# Patient Record
Sex: Male | Born: 1985 | Hispanic: Yes | Marital: Married | Smoking: Never smoker
Health system: Southern US, Community
[De-identification: ages and names within clinical notes are randomized; demographics above are authoritative.]

## PROBLEM LIST (undated history)

## (undated) DIAGNOSIS — I10 Essential (primary) hypertension: Secondary | ICD-10-CM

---

## 2018-04-07 ENCOUNTER — Encounter (HOSPITAL_COMMUNITY): Payer: Self-pay | Admitting: Emergency Medicine

## 2018-04-07 ENCOUNTER — Emergency Department (HOSPITAL_COMMUNITY)
Admission: EM | Admit: 2018-04-07 | Discharge: 2018-04-07 | Disposition: A | Discharge status: Home / Self Care | Payer: Self-pay | Attending: Emergency Medicine | Admitting: Emergency Medicine

## 2018-04-07 ENCOUNTER — Other Ambulatory Visit: Payer: Self-pay

## 2018-04-07 ENCOUNTER — Emergency Department (HOSPITAL_COMMUNITY): Payer: Self-pay

## 2018-04-07 DIAGNOSIS — M436 Torticollis: Secondary | ICD-10-CM | POA: Insufficient documentation

## 2018-04-07 MED ORDER — METHOCARBAMOL 500 MG PO TABS
1000.0000 mg | ORAL_TABLET | Freq: Once | ORAL | Status: AC
  Administered 2018-04-07: 1000 mg via ORAL
  Filled 2018-04-07: qty 2

## 2018-04-07 MED ORDER — CYCLOBENZAPRINE HCL 10 MG PO TABS: 10.0000 mg | ORAL_TABLET | Freq: Two times a day (BID) | ORAL | 0 refills | Status: DC | PRN

## 2018-04-07 MED ORDER — ACETAMINOPHEN 325 MG PO TABS
650.0000 mg | ORAL_TABLET | Freq: Once | ORAL | Status: AC
  Administered 2018-04-07: 650 mg via ORAL
  Filled 2018-04-07: qty 2

## 2018-04-07 MED ORDER — IBUPROFEN 400 MG PO TABS
600.0000 mg | ORAL_TABLET | Freq: Once | ORAL | Status: AC
  Administered 2018-04-07: 600 mg via ORAL
  Filled 2018-04-07: qty 1

## 2018-04-07 NOTE — ED Provider Notes (Signed)
[facility] EMERGENCY DEPARTMENT Provider Note   CSN: [id number] Arrival date & time: 04/07/18  0944     History   Chief Complaint Chief Complaint  Patient presents with  . Neck Pain    HPI Michael Brown is a 32 y.o. male with no significant past medical history presents emergency department today for right-sided neck pain.  Patient reports that he awoke this morning with a "crick" in his neck.  He feels like he has a cramp on the right side of his neck.  He reports he does not take anything for his symptoms.  He reports palpation and range of motion make his symptoms worse.  The pain is constant.  He denies any pain with neck flexion or extension.  No fever, visual changes, vertigo, numbness/tingling/weakness of the extremity, bowel/bladder incontinence, chest pain, shortness of breath, neck trauma, difficulty swallowing reported.  No previous neck surgeries.  HPI  History reviewed. No pertinent past medical history.  There are no active problems to display for this patient.   No significant past medical history or surgical history reported      Home Medications    Prior to Admission medications   Not on File    Family History No family history on file.  Social History Social History   Tobacco Use  . Smoking status: Never Smoker  . Smokeless tobacco: Never Used  Substance Use Topics  . Alcohol use: Yes  . Drug use: Not Currently     Allergies   Patient has no allergy information on record.   Review of Systems Review of Systems  Constitutional: Negative for chills and fever.  HENT: Negative for sore throat, trouble swallowing and voice change.   Eyes: Negative for visual disturbance.  Respiratory: Negative for cough and shortness of breath.   Cardiovascular: Negative for chest pain.  Gastrointestinal: Negative for nausea.       No bowel incontinence  Genitourinary: Negative for difficulty urinating.       No urinary  incontinence  Musculoskeletal: Positive for neck pain. Negative for arthralgias and neck stiffness.  Skin: Negative for color change and wound.  Neurological: Negative for dizziness, weakness, numbness and headaches.  All other systems reviewed and are negative.    Physical Exam Updated Vital Signs BP (!) 146/99 (BP Location: Left Arm)   Pulse 84   Temp 98.1 F (36.7 C) (Oral)   Resp 16   SpO2 100%   Physical Exam  Constitutional: He appears well-developed and well-nourished.  HENT:  Head: Normocephalic and atraumatic.  Right Ear: External ear normal.  Left Ear: External ear normal.  Nose: Nose normal.  Mouth/Throat: Uvula is midline, oropharynx is clear and moist and mucous membranes are normal. No tonsillar exudate.  Eyes: Pupils are equal, round, and reactive to light. Right eye exhibits no discharge. Left eye exhibits no discharge. No scleral icterus.  Neck: Trachea normal. Neck supple. No JVD present. Muscular tenderness present. No spinous process tenderness present. Carotid bruit is not present. No neck rigidity. Normal range of motion present.  Neck in a flexed and turned to the left position at 45 degrees.  There is tenderness palpation of the right trapezius and sternocleidomastoid.  No bony tenderness palpation or step-offs.  No meningismus or meningeal signs.  Cardiovascular: Normal rate, regular rhythm and intact distal pulses.  No murmur heard. Pulses:      Radial pulses are 2+ on the right side, and 2+ on the left side.  Dorsalis pedis pulses are 2+ on the right side, and 2+ on the left side.       Posterior tibial pulses are 2+ on the right side, and 2+ on the left side.  No lower extremity swelling or edema. Calves symmetric in size bilaterally.  Pulmonary/Chest: Effort normal and breath sounds normal. He exhibits no tenderness.  Abdominal: Soft. Bowel sounds are normal. There is no tenderness. There is no rebound and no guarding.  Musculoskeletal: He  exhibits no edema.  Lymphadenopathy:    He has no cervical adenopathy.  Neurological: He is alert.  Speech clear. Follows commands. No facial droop. PERRLA. EOMI. Normal peripheral fields. CN III-XII intact.  Grossly moves all extremities 4 without ataxia. Coordination intact. Able and appropriate strength for age to upper and lower extremities bilaterally including grip strength & plantar flexion/dorsiflexion. Sensation to light touch intact bilaterally for upper and lower. Patellar deep tendon reflex 2+ and equal bilaterally. Normal finger to nose. No pronator drift. Normal gait.   Skin: Skin is warm and dry. No rash noted. He is not diaphoretic.  Psychiatric: He has a normal mood and affect.  Nursing note and vitals reviewed.    ED Treatments / Results  Labs (all labs ordered are listed, but only abnormal results are displayed) Labs Reviewed - No data to display  EKG None  Radiology No results found.  Procedures Procedures (including critical care time)  Medications Ordered in ED Medications  methocarbamol (ROBAXIN) tablet 1,000 mg (has no administration in time range)  acetaminophen (TYLENOL) tablet 650 mg (has no administration in time range)  ibuprofen (ADVIL,MOTRIN) tablet 600 mg (has no administration in time range)     Initial Impression / Assessment and Plan / ED Course  I have reviewed the triage vital signs and the nursing notes.  Pertinent labs & imaging results that were available during my care of the patient were reviewed by me and considered in my medical decision making (see chart for details).     32 year old male with atraumatic neck pain.  Patient is afebrile, without meningeal signs.  Do not suspect meningitis.  Patient with normal neurologic exam.  No bowel/bladder incontinence.  Do not feel needs work-up for dissection.  No concern for cauda equina.  X-rays with without fracture dislocation and consistent with torticollis.  These were reviewed by  myself.  Patient's symptoms improved in the department.  Will discharge home on muscle relaxers and follow with PCP.  Return precautions discussed.  Patient appears safe for discharge.  Final Clinical Impressions(s) / ED Diagnoses   Final diagnoses:  Torticollis    ED Discharge Orders         Ordered    cyclobenzaprine (FLEXERIL) 10 MG tablet  2 times daily PRN     04/07/18 1156           Princella PellegriniMaczis, Charlena Haub M, PA-C 04/07/18 1159    Gerhard MunchLockwood, Robert, MD 04/07/18 1626

## 2018-04-07 NOTE — Discharge Instructions (Signed)
Your xray and exam is consistent with torticollis as we discussed.  Please see attached handout on this.  Please follow-up with her primary care provider.  Take medications as prescribed.  Please not drive or operate heavy machinery while taking medications as they may make you sleepy. If you develop worsening or new concerning symptoms you can return to the emergency department for re-evaluation.

## 2018-04-07 NOTE — ED Notes (Signed)
Patient verbalizes understanding of discharge instructions. Opportunity for questioning and answers were provided. Armband removed by staff, pt discharged from ED ambulatory.   

## 2018-04-07 NOTE — ED Notes (Signed)
Please see Emergency Provider's history and physical for full assessment. 

## 2018-04-07 NOTE — ED Triage Notes (Signed)
Pt. Stated, I woke up this morning with neck pain like a cramp.

## 2018-04-07 NOTE — ED Notes (Signed)
Patient transported to x-ray. ?

## 2019-06-02 ENCOUNTER — Other Ambulatory Visit: Payer: Self-pay

## 2019-06-02 DIAGNOSIS — Z20822 Contact with and (suspected) exposure to covid-19: Secondary | ICD-10-CM

## 2019-06-04 LAB — NOVEL CORONAVIRUS, NAA: SARS-CoV-2, NAA: DETECTED — AB

## 2019-12-19 DIAGNOSIS — I639 Cerebral infarction, unspecified: Secondary | ICD-10-CM

## 2019-12-19 HISTORY — DX: Cerebral infarction, unspecified: I63.9

## 2020-05-25 ENCOUNTER — Encounter (HOSPITAL_COMMUNITY): Payer: Self-pay

## 2020-05-25 ENCOUNTER — Emergency Department (HOSPITAL_COMMUNITY): Payer: Self-pay

## 2020-05-25 ENCOUNTER — Emergency Department (HOSPITAL_COMMUNITY)
Admission: EM | Admit: 2020-05-25 | Discharge: 2020-05-26 | Disposition: A | Discharge status: Home / Self Care | Payer: Self-pay | Attending: Emergency Medicine | Admitting: Emergency Medicine

## 2020-05-25 ENCOUNTER — Other Ambulatory Visit: Payer: Self-pay

## 2020-05-25 DIAGNOSIS — R202 Paresthesia of skin: Secondary | ICD-10-CM | POA: Insufficient documentation

## 2020-05-25 LAB — CBC
HCT: 44.7 % (ref 39.0–52.0)
Hemoglobin: 15.8 g/dL (ref 13.0–17.0)
MCH: 31.3 pg (ref 26.0–34.0)
MCHC: 35.3 g/dL (ref 30.0–36.0)
MCV: 88.7 fL (ref 80.0–100.0)
Platelets: 256 10*3/uL (ref 150–400)
RBC: 5.04 MIL/uL (ref 4.22–5.81)
RDW: 12 % (ref 11.5–15.5)
WBC: 7.7 10*3/uL (ref 4.0–10.5)
nRBC: 0 % (ref 0.0–0.2)

## 2020-05-25 LAB — DIFFERENTIAL
Abs Immature Granulocytes: 0.02 10*3/uL (ref 0.00–0.07)
Basophils Absolute: 0.1 10*3/uL (ref 0.0–0.1)
Basophils Relative: 1 %
Eosinophils Absolute: 0.3 10*3/uL (ref 0.0–0.5)
Eosinophils Relative: 3 %
Immature Granulocytes: 0 %
Lymphocytes Relative: 28 %
Lymphs Abs: 2.2 10*3/uL (ref 0.7–4.0)
Monocytes Absolute: 0.6 10*3/uL (ref 0.1–1.0)
Monocytes Relative: 8 %
Neutro Abs: 4.6 10*3/uL (ref 1.7–7.7)
Neutrophils Relative %: 60 %

## 2020-05-25 LAB — APTT: aPTT: 26 seconds (ref 24–36)

## 2020-05-25 LAB — COMPREHENSIVE METABOLIC PANEL
ALT: 23 U/L (ref 0–44)
AST: 26 U/L (ref 15–41)
Albumin: 4.5 g/dL (ref 3.5–5.0)
Alkaline Phosphatase: 55 U/L (ref 38–126)
Anion gap: 12 (ref 5–15)
BUN: 14 mg/dL (ref 6–20)
CO2: 20 mmol/L — ABNORMAL LOW (ref 22–32)
Calcium: 9.6 mg/dL (ref 8.9–10.3)
Chloride: 107 mmol/L (ref 98–111)
Creatinine, Ser: 0.96 mg/dL (ref 0.61–1.24)
GFR, Estimated: >60 mL/min (ref >60)
Glucose, Bld: 100 mg/dL — ABNORMAL HIGH (ref 70–99)
Potassium: 3.5 mmol/L (ref 3.5–5.1)
Sodium: 139 mmol/L (ref 135–145)
Total Bilirubin: 1.2 mg/dL (ref 0.3–1.2)
Total Protein: 7.6 g/dL (ref 6.5–8.1)

## 2020-05-25 LAB — I-STAT CHEM 8, ED
BUN: 15 mg/dL (ref 6–20)
Calcium, Ion: 1.23 mmol/L (ref 1.15–1.40)
Chloride: 107 mmol/L (ref 98–111)
Creatinine, Ser: 0.8 mg/dL (ref 0.61–1.24)
Glucose, Bld: 89 mg/dL (ref 70–99)
HCT: 45 % (ref 39.0–52.0)
Hemoglobin: 15.3 g/dL (ref 13.0–17.0)
Potassium: 3.4 mmol/L — ABNORMAL LOW (ref 3.5–5.1)
Sodium: 140 mmol/L (ref 135–145)
TCO2: 21 mmol/L — ABNORMAL LOW (ref 22–32)

## 2020-05-25 LAB — PROTIME-INR
INR: 1.1 (ref 0.8–1.2)
Prothrombin Time: 13.5 seconds (ref 11.4–15.2)

## 2020-05-25 MED ORDER — DIPHENHYDRAMINE HCL 50 MG/ML IJ SOLN: 12.5000 mg | Freq: Once | INTRAMUSCULAR | Status: DC

## 2020-05-25 MED ORDER — SODIUM CHLORIDE 0.9% FLUSH
3.0000 mL | Freq: Once | INTRAVENOUS | Status: DC
Start: 2020-05-25 — End: 2020-05-26

## 2020-05-25 MED ORDER — METOCLOPRAMIDE HCL 5 MG/ML IJ SOLN: 10.0000 mg | Freq: Once | INTRAMUSCULAR | Status: DC

## 2020-05-25 NOTE — ED Provider Notes (Signed)
[facility] EMERGENCY DEPARTMENT Provider Note   CSN: [id number] Arrival date & time: 05/25/20  1907     History Chief Complaint  Patient presents with   Numbness    Michael Brown is a 34 y.o. male with a history of prior CVA w/ left vertebral artery dissection who presents to the ED with complaints of intermittent paresthesias since this afternoon around 1PM.  Patient states that his symptoms began as left upper and lower extremity numbness/tingling, subsequently developed some right upper and lower extremity tingling as well.  His paresthesias have been intermittent lasting a few minutes at a time in variable locations to the extremities.  Sometime in the early evening he got a mild discomfort to the back of his head.  His combination of intermittent paresthesias and discomfort prompted him to present to the emergency department.  He states while he was in the lobby he did have an episode of right-sided facial paresthesias.  He mentions that he had some left upper extremity weakness intermittently over the past 2 days.  He currently is asymptomatic with the exception that his bilateral lower extremities feel "a bit shaky".  He has no current pain, numbness, tingling, or weakness.  He denies visual disturbance, speech change, facial droop, fever, chills, chest pain, dyspnea, abdominal pain, nausea, vomiting, or diarrhea.  He denies any recent strenuous activity or traumatic injuries.  Patient's preferred language is documented as [place], he states he speaks both [place] and [place], I discussed whether not he would prefer to utilize a Nurse, learning disability for communication in [place], he would prefer to speak [place].  He was able to provide appropriate history.  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     There are no problems to display for this patient.   History  reviewed. No pertinent surgical history.     No family history on file.  Social History   Tobacco Use   Smoking status: Never Smoker   Smokeless tobacco: Never Used  Substance Use Topics   Alcohol use: Yes   Drug use: Not Currently    Home Medications Prior to Admission medications   Medication Sig Start Date End Date Taking? Authorizing Provider  cyclobenzaprine (FLEXERIL) 10 MG tablet Take 1 tablet (10 mg total) by mouth 2 (two) times daily as needed for muscle spasms. 04/07/18   Maczis, Elmer Sow, PA-C    Allergies    Patient has no known allergies.  Review of Systems   Review of Systems  Constitutional: Negative for chills and fever.  Respiratory: Negative for cough and shortness of breath.   Cardiovascular: Negative for chest pain.  Gastrointestinal: Negative for abdominal pain, blood in stool, constipation, diarrhea, nausea and vomiting.  Genitourinary: Negative for dysuria.  Musculoskeletal: Negative for myalgias.  Neurological: Positive for weakness, numbness and headaches. Negative for dizziness, seizures, syncope and speech difficulty.  All other systems reviewed and are negative.   Physical Exam Updated Vital Signs BP (!) 139/93    Pulse 65    Temp 98.6 F (37 C) (Oral)    Resp 17    SpO2 100%   Physical Exam Vitals and nursing note reviewed.  Constitutional:      General: He is not in acute distress.    Appearance: Normal appearance. He is not toxic-appearing.  HENT:     Head: Normocephalic and atraumatic.     Mouth/Throat:  Pharynx: Oropharynx is clear. Uvula midline.  Eyes:     General: Vision grossly intact. Gaze aligned appropriately.     Extraocular Movements: Extraocular movements intact.     Conjunctiva/sclera: Conjunctivae normal.     Pupils: Pupils are equal, round, and reactive to light.     Comments: No proptosis.   Cardiovascular:     Rate and Rhythm: Normal rate and regular rhythm.  Pulmonary:     Effort: Pulmonary effort is  normal.     Breath sounds: Normal breath sounds.  Abdominal:     General: There is no distension.     Palpations: Abdomen is soft.     Tenderness: There is no abdominal tenderness. There is no guarding or rebound.  Musculoskeletal:     Cervical back: Normal range of motion and neck supple. No rigidity.  Skin:    General: Skin is warm and dry.  Neurological:     Mental Status: He is alert.     Comments: Alert. Clear speech. No facial droop. CNIII-XII grossly intact. Bilateral upper and lower extremities' sensation grossly intact. 5/5 symmetric strength with grip strength and with plantar and dorsi flexion bilaterally . Normal finger to nose bilaterally. Negative pronator drift. Gait intact.    Psychiatric:        Mood and Affect: Mood normal.        Behavior: Behavior normal.    ED Results / Procedures / Treatments   Labs (all labs ordered are listed, but only abnormal results are displayed) Labs Reviewed  COMPREHENSIVE METABOLIC PANEL - Abnormal; Notable for the following components:      Result Value   CO2 20 (*)    Glucose, Bld 100 (*)    All other components within normal limits  I-STAT CHEM 8, ED - Abnormal; Notable for the following components:   Potassium 3.4 (*)    TCO2 21 (*)    All other components within normal limits  PROTIME-INR  APTT  CBC  DIFFERENTIAL  CBG MONITORING, ED    EKG EKG Interpretation  Date/Time:  Tuesday May 25 2020 22:21:17 EDT Ventricular Rate:  79 PR Interval:    QRS Duration: 99 QT Interval:  396 QTC Calculation: 454 R Axis:   74 Text Interpretation: Sinus rhythm Multiple ventricular premature complexes np previous Confirmed by Vanetta Mulders [id number]) on 05/25/2020 11:45:08 PM   Radiology CT HEAD WO CONTRAST  Addendum Date: 05/25/2020   ADDENDUM REPORT: 05/25/2020 21:47 ADDENDUM: These results were called by telephone at the time of interpretation on 05/25/2020 at 9:46 pm to provider Vanetta Mulders , who verbally  acknowledged these results. Electronically Signed   By: Tish Frederickson M.D.   On: 05/25/2020 21:47   Result Date: 05/25/2020 CLINICAL DATA:  numbness/tingling left arm and left leg, headache. Onset 1 pm left arm and left leg numbness and tingling. Onset 6pm posterior headache EXAM: CT HEAD WITHOUT CONTRAST TECHNIQUE: Contiguous axial images were obtained from the base of the skull through the vertex without intravenous contrast. COMPARISON:  None. FINDINGS: Brain: Left cerebellar hypodensity with otherwise no evidence of large-territorial acute infarction. No parenchymal hemorrhage. No mass lesion. No extra-axial collection. No mass effect or midline shift. No hydrocephalus. Basilar cisterns are patent. Vascular: No hyperdense vessel. Skull: No acute fracture or focal lesion. Sinuses/Orbits: Paranasal sinuses and mastoid air cells are clear. The orbits are unremarkable. Other: None. IMPRESSION: 1. Age-indeterminate left cerebellar infarction. Recommend MRI noncontrast for further evaluation. 2. No intracranial hemorrhage. Electronically Signed: By: Tish Frederickson  M.D. On: 05/25/2020 21:42   MR BRAIN WO CONTRAST  Result Date: 05/26/2020 CLINICAL DATA:  Left sided numbness and tingling EXAM: MRI HEAD WITHOUT CONTRAST TECHNIQUE: Multiplanar, multiecho pulse sequences of the brain and surrounding structures were obtained without intravenous contrast. COMPARISON:  None. FINDINGS: Brain: No acute infarct, acute hemorrhage or extra-axial collection. Normal white matter signal. Old left cerebellar infarct. Normal volume of CSF spaces. No chronic microhemorrhage. Normal midline structures. Vascular: Normal flow voids. Skull and upper cervical spine: Normal marrow signal. Sinuses/Orbits: Negative. Other: None. IMPRESSION: Old left cerebellar infarct. Otherwise normal brain MRI. Electronically Signed   By: Deatra Robinson M.D.   On: 05/26/2020 03:04    Procedures Procedures (including critical care  time)  Medications Ordered in ED Medications  sodium chloride flush (NS) 0.9 % injection 3 mL (has no administration in time range)    ED Course  I have reviewed the triage vital signs and the nursing notes.  Pertinent labs & imaging results that were available during my care of the patient were reviewed by me and considered in my medical decision making (see chart for details).    MDM Rules/Calculators/A&P                          Patient presents to the ED with complaints of paresthesias, weakness, & headache- relatively asymptomatic currently other than "shakiness" to his legs. He is nontoxic, resting comfortably, vitals WNL with the exception of mildly elevated BP. Physical exam is benign. The patient has no focal neurologic deficits.   Additional history obtained:  Additional history obtained from chart review & nursing note review. Previous records obtained and reviewed: Hospital admission 12/19/19 after presenting with complaints of visual disturbance, headache, dizziness, & difficulty walking found to have acute ischemic infarcts in the left cerebellum as well as small punctate in the right cerebellum that were felt to be due to spontaneous left vertebral artery dissection. No TPA administration, no surgical intervention. Was started on coumadin & discharged. Ultimately coumadin was discontinued - he is currently on aspirin.   Lab Tests:  I reviewed and interpreted labs, which included:  CBC, differential, CMP, APTT, PT/INR- fairly unremarkable.   Imaging Studies ordered:  CT head wo contrast ordered per triage, I independently visualized and interpreted imaging which showed Age-indeterminate left cerebellar infarction. Recommend MRI noncontrast for further evaluation. No intracranial hemorrhage.  Benign physical exam, no focal neuro deficits, outside of TPA window & is VAN negative- no code stroke.   22:50: CONSULT: Discussed case with neurologist Dr. Amada Jupiter, recommends  MRI brain without contrast, if no acute process can likely be discharged home, in agreement with migraine cocktail trial.  Appreciate consultation.  MRI:  Old left cerebellar infarct. Otherwise normal brain MRI Reassuring ED work up in regards to labs & imaging.  No acute process on MRI, patient remains asymptomatic on reevaluation.  Overall reassuring work-up.  Will discharge home at this time with follow-up with his outpatient neurologist. I discussed results, treatment plan, need for follow-up, and return precautions with the patient. Provided opportunity for questions, patient confirmed understanding and is in agreement with plan.   Findings and plan of care discussed with supervising physician Dr. Deretha Emory who is in agreement.   Portions of this note were generated with Scientist, clinical (histocompatibility and immunogenetics). Dictation errors may occur despite best attempts at proofreading.  Final Clinical Impression(s) / ED Diagnoses Final diagnoses:  Paresthesia    Rx / DC Orders ED Discharge Orders  None       Cherly Andersonetrucelli, Carlyn Mullenbach R, PA-C 05/26/20 [id number]    Vanetta MuldersZackowski, Scott, MD 05/27/20 [phone]

## 2020-05-25 NOTE — ED Triage Notes (Signed)
Onset 1 pm left arm and left leg numbness and tingling.  Onset 6pm posterior headache.   No facial droop.  Hand grips equal Negative VAN.

## 2020-05-26 ENCOUNTER — Emergency Department (HOSPITAL_COMMUNITY): Payer: Self-pay

## 2020-05-26 NOTE — ED Notes (Signed)
Pt would prefer to not have IV access at this time. RN will continue to monitor for need to have IV access.

## 2020-05-26 NOTE — ED Notes (Signed)
This RN called MRI to confirm scan. Pt will be taken to MRI shortly. RN will continue to monitor.

## 2020-05-26 NOTE — ED Notes (Signed)
Pt transported to MRI at this time 

## 2020-05-26 NOTE — Discharge Instructions (Addendum)
You were seen in the emergency department today for tingling and pain in the back of your head.  Your work-up was overall reassuring.  Your blood work did not show significant abnormalities.  We performed an MRI which showed findings of your prior stroke, however there were no new abnormalities.  Please take Tylenol per the counter dosing to help with any return of pain.  Please follow-up with your neurologist within the next 1 to 3 days for reevaluation.  Return to the ER for new or worsening symptoms including but not limited to return of tingling/numbness, weakness, worsening pain, sudden onset of pain, fever, trouble with your speech, change in your vision, trouble walking, dizziness like the room spinning, persistent numbness/weakness on one side of your body, or any other concerns.

## 2021-08-01 IMAGING — MR MR HEAD W/O CM
10 of 11 series · 42 of 48 positions shown · non-contrast
Comparison: None.

CLINICAL DATA: Left sided numbness and tingling

EXAM:
MRI HEAD WITHOUT CONTRAST
TECHNIQUE: Multiplanar, multiecho pulse sequences of the brain and surrounding
structures were obtained without intravenous contrast.

[Series 5: DWI · axial · 3.0mm · 0.88mm/px · z∈[-65,+74]mm · 9 of 96 slices shown (1 of 4)]
[im 1/96]
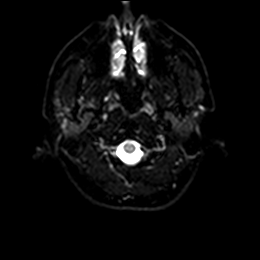
[im 12/96]
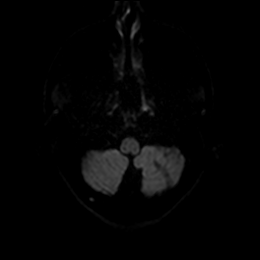
[im 24/96]
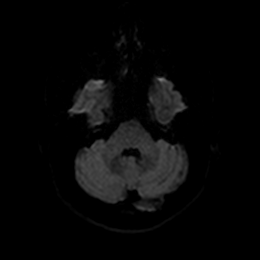
[im 36/96]
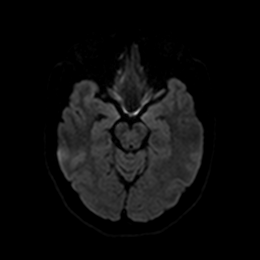
[im 48/96]
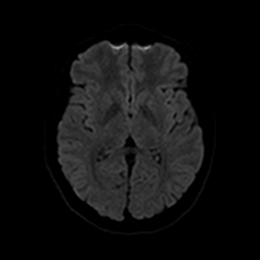
[im 60/96]
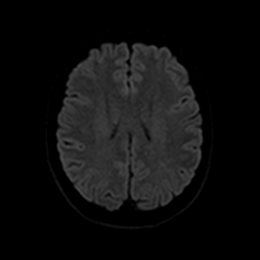
[im 72/96]
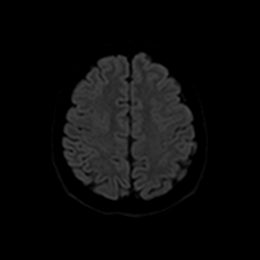
[im 84/96]
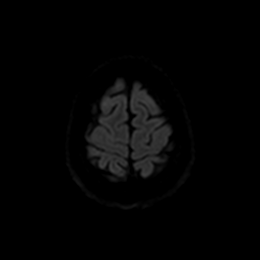
[im 96/96]
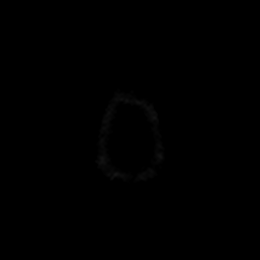

[Series 6: DWI · axial · 3.0mm · 0.88mm/px · z∈[-65,+74]mm · 4 of 48 slices shown (2 of 4)]
[im 1/48]
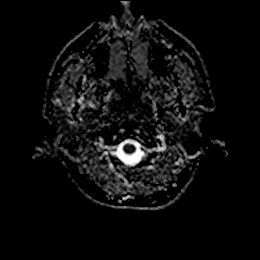
[im 16/48]
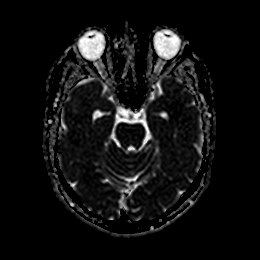
[im 32/48]
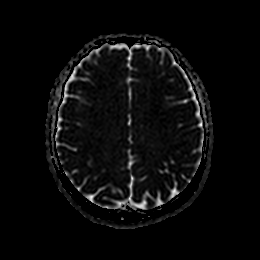
[im 48/48]
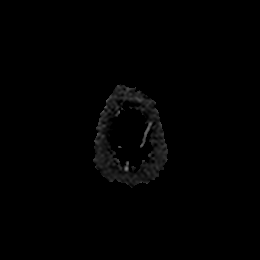

[Series 7: DWI · coronal · 4.0mm · 0.88mm/px · 6 of 64 slices shown (3 of 4)]
[im 1/64]
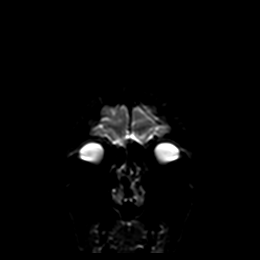
[im 13/64]
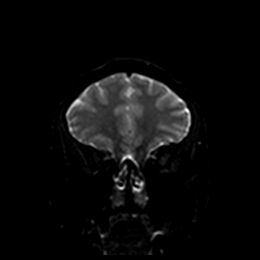
[im 26/64]
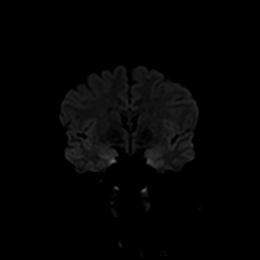
[im 38/64]
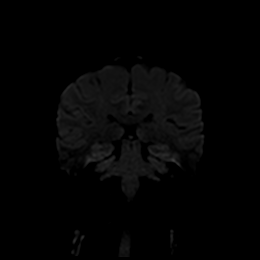
[im 51/64]
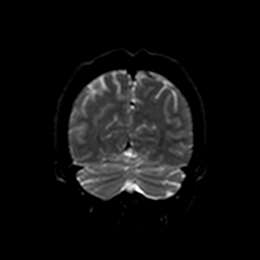
[im 64/64]
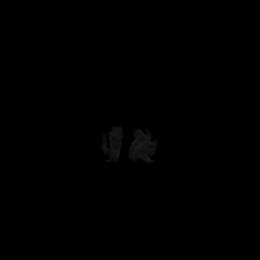

[Series 8: DWI · coronal · 4.0mm · 0.88mm/px · 3 of 32 slices shown (4 of 4)]
[im 1/32]
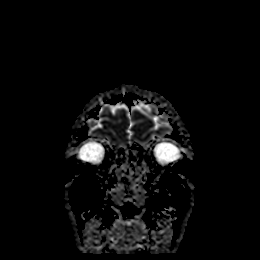
[im 16/32]
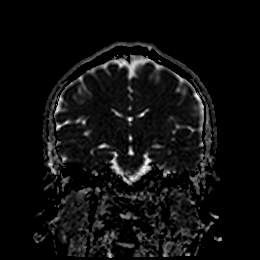
[im 32/32]
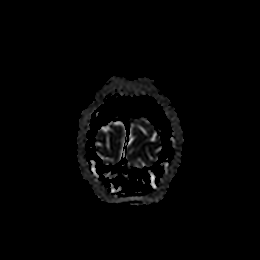

[Series 9: T1 · sagittal · 5.0mm · 0.75mm/px · 2 of 23 slices shown]
[im 1/23]
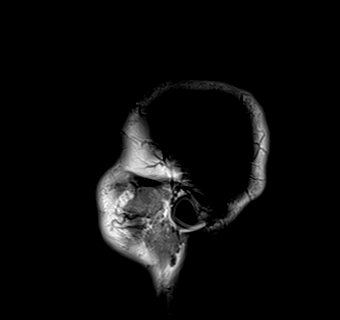
[im 23/23]
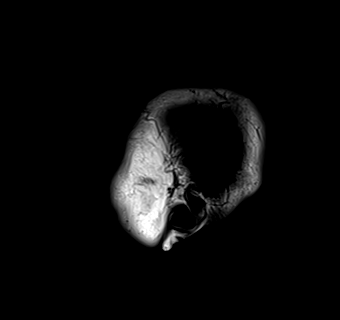

[Series 10: T2 · axial · 5.0mm · 0.72mm/px · z∈[-66,+76]mm · 2 of 25 slices shown (1 of 2)]
[im 1/25]
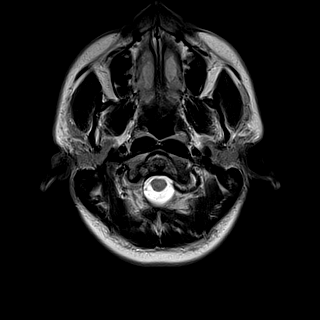
[im 25/25]
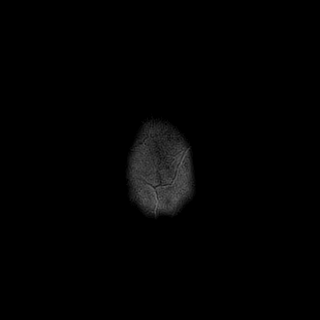

[Series 11: FLAIR · axial · 5.0mm · 0.45mm/px · z∈[-66,+76]mm · 2 of 25 slices shown]
[im 1/25]
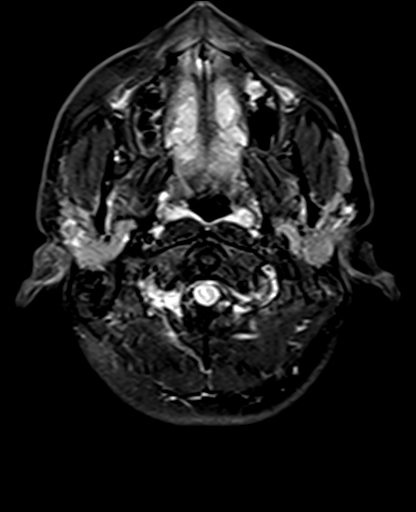
[im 25/25]
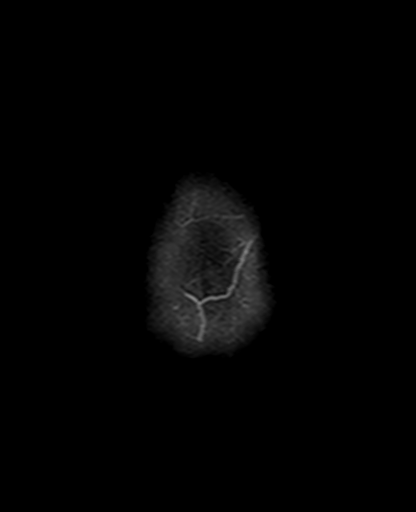

[Series 13: pha_images · axial · 3.0mm · 0.90mm/px · z∈[-83,+89]mm · 5 of 58 slices shown]
[im 1/58]
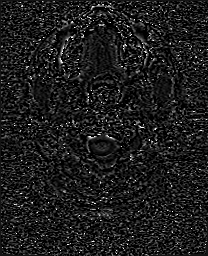
[im 15/58]
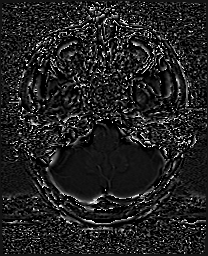
[im 29/58]
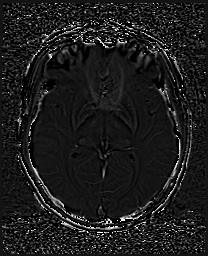
[im 43/58]
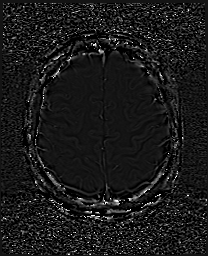
[im 58/58]
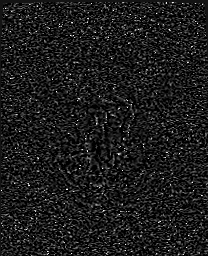

[Series 14: swi_images · axial · 3.0mm · 0.90mm/px · z∈[-83,+92]mm · 6 of 60 slices shown]
[im 1/60]
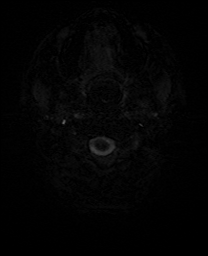
[im 12/60]
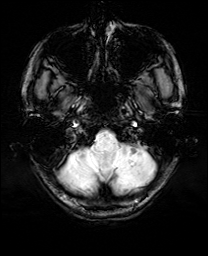
[im 24/60]
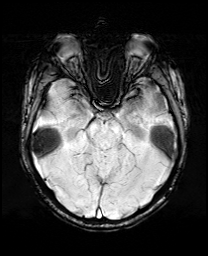
[im 36/60]
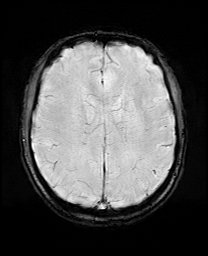
[im 48/60]
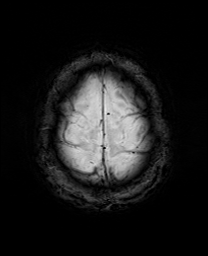
[im 60/60]
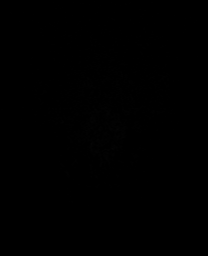

[Series 17: T2 · coronal · 5.0mm · 0.34mm/px · 3 of 29 slices shown (2 of 2)]
[im 1/29]
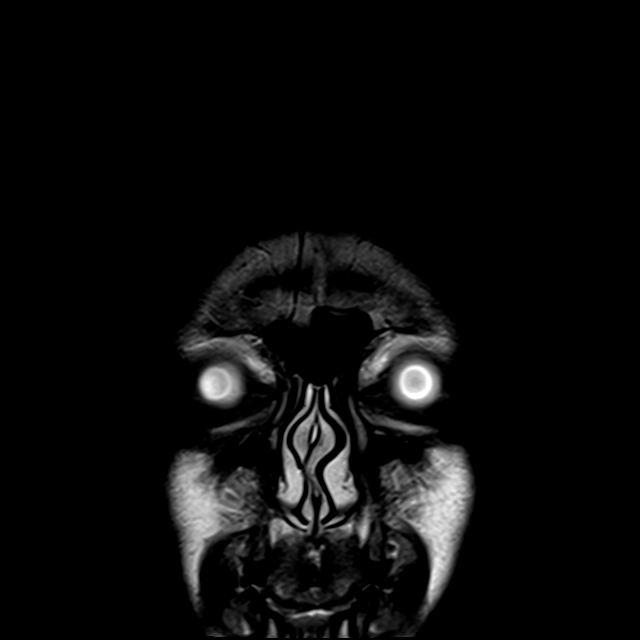
[im 15/29]
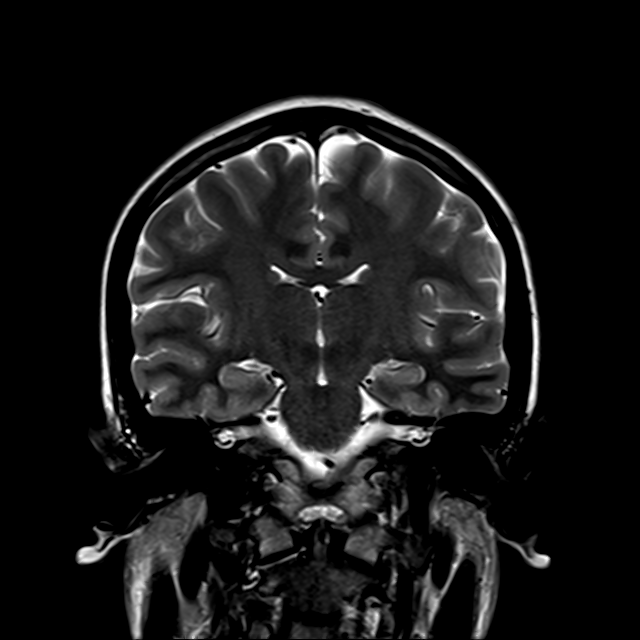
[im 29/29]
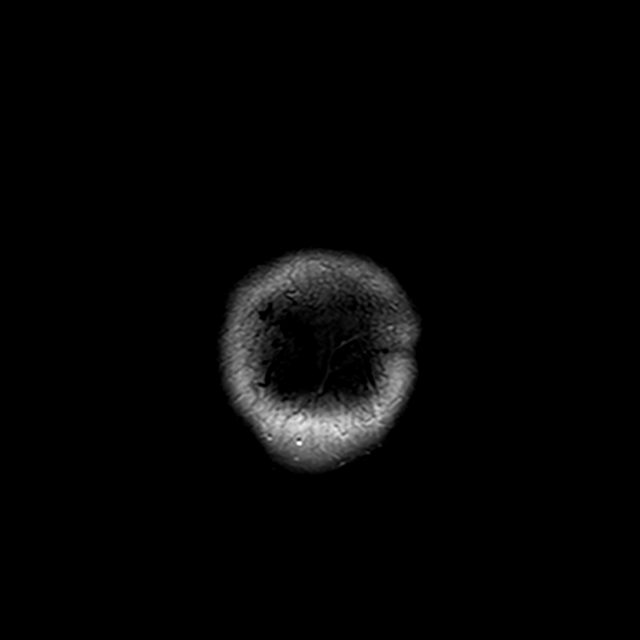

[42 of 48 positions shown; findings below may reference images not displayed]

FINDINGS: Brain: No acute infarct, acute hemorrhage or extra-axial collection.
Normal white matter signal. Old left cerebellar infarct. Normal
volume of CSF spaces. No chronic microhemorrhage. Normal midline
structures.

Vascular: Normal flow voids.

Skull and upper cervical spine: Normal marrow signal.

Sinuses/Orbits: Negative.

Other: None.
IMPRESSION: Old left cerebellar infarct. Otherwise normal brain MRI.

## 2022-02-06 ENCOUNTER — Encounter (HOSPITAL_COMMUNITY): Payer: Self-pay

## 2022-02-06 ENCOUNTER — Ambulatory Visit: Admission: EM | Admit: 2022-02-06 | Discharge: 2022-02-06 | Payer: Self-pay

## 2022-02-06 ENCOUNTER — Other Ambulatory Visit: Payer: Self-pay

## 2022-02-06 ENCOUNTER — Emergency Department (HOSPITAL_COMMUNITY)
Admission: EM | Admit: 2022-02-06 | Discharge: 2022-02-06 | Disposition: A | Discharge status: Home / Self Care | Payer: Self-pay | Attending: Emergency Medicine | Admitting: Emergency Medicine

## 2022-02-06 DIAGNOSIS — K920 Hematemesis: Secondary | ICD-10-CM | POA: Insufficient documentation

## 2022-02-06 DIAGNOSIS — Z7982 Long term (current) use of aspirin: Secondary | ICD-10-CM | POA: Insufficient documentation

## 2022-02-06 DIAGNOSIS — Z8673 Personal history of transient ischemic attack (TIA), and cerebral infarction without residual deficits: Secondary | ICD-10-CM | POA: Insufficient documentation

## 2022-02-06 HISTORY — DX: Essential (primary) hypertension: I10

## 2022-02-06 LAB — COMPREHENSIVE METABOLIC PANEL
ALT: 34 U/L (ref 0–44)
AST: 30 U/L (ref 15–41)
Albumin: 4.1 g/dL (ref 3.5–5.0)
Alkaline Phosphatase: 57 U/L (ref 38–126)
Anion gap: 11 (ref 5–15)
BUN: 19 mg/dL (ref 6–20)
CO2: 24 mmol/L (ref 22–32)
Calcium: 9.4 mg/dL (ref 8.9–10.3)
Chloride: 105 mmol/L (ref 98–111)
Creatinine, Ser: 0.99 mg/dL (ref 0.61–1.24)
GFR, Estimated: >60 mL/min (ref >60)
Glucose, Bld: 99 mg/dL (ref 70–99)
Potassium: 3.8 mmol/L (ref 3.5–5.1)
Sodium: 140 mmol/L (ref 135–145)
Total Bilirubin: 0.8 mg/dL (ref 0.3–1.2)
Total Protein: 7.1 g/dL (ref 6.5–8.1)

## 2022-02-06 LAB — CBC WITH DIFFERENTIAL/PLATELET
Abs Immature Granulocytes: 0.02 10*3/uL (ref 0.00–0.07)
Basophils Absolute: 0.1 10*3/uL (ref 0.0–0.1)
Basophils Relative: 1 %
Eosinophils Absolute: 0.3 10*3/uL (ref 0.0–0.5)
Eosinophils Relative: 4 %
HCT: 46 % (ref 39.0–52.0)
Hemoglobin: 15.8 g/dL (ref 13.0–17.0)
Immature Granulocytes: 0 %
Lymphocytes Relative: 31 %
Lymphs Abs: 2.5 10*3/uL (ref 0.7–4.0)
MCH: 31.1 pg (ref 26.0–34.0)
MCHC: 34.3 g/dL (ref 30.0–36.0)
MCV: 90.6 fL (ref 80.0–100.0)
Monocytes Absolute: 0.8 10*3/uL (ref 0.1–1.0)
Monocytes Relative: 11 %
Neutro Abs: 4.3 10*3/uL (ref 1.7–7.7)
Neutrophils Relative %: 53 %
Platelets: 266 10*3/uL (ref 150–400)
RBC: 5.08 MIL/uL (ref 4.22–5.81)
RDW: 12.1 % (ref 11.5–15.5)
WBC: 8 10*3/uL (ref 4.0–10.5)
nRBC: 0 % (ref 0.0–0.2)

## 2022-02-06 LAB — ABO/RH: ABO/RH(D): O POS

## 2022-02-06 LAB — TYPE AND SCREEN
ABO/RH(D): O POS
Antibody Screen: NEGATIVE

## 2022-02-06 LAB — LIPASE, BLOOD: Lipase: 25 U/L (ref 11–51)

## 2022-02-06 MED ORDER — OMEPRAZOLE MAGNESIUM 20 MG PO TBEC: 40.0000 mg | DELAYED_RELEASE_TABLET | Freq: Every day | ORAL | 1 refills | Status: AC

## 2022-02-06 NOTE — ED Provider Notes (Signed)
[facility] EMERGENCY DEPARTMENT Provider Note   CSN: [id number] Arrival date & time: 02/06/22  1642     History  Chief Complaint  Patient presents with   Emesis    Michael Brown is a 36 y.o. male.  36 year old man presents with 1 episode of hematemesis last night.  Patient had several alcoholic beverages and had an episode of nausea and vomiting in his driveway.  It was dark outside but it appeared to be blood.  This morning they went and looked again, and the vomit appeared like coffee-ground emesis.  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  He reports that he drinks about 2 beers every night.  He did has no history of alcohol withdrawal, reports that he can go days or weeks without drinking, but of late he has been drinking every night.  He denies fever, chills, rash, cough, shortness of breath, chest pain, diarrhea.       Home Medications Prior to Admission medications   Medication Sig Start Date End Date Taking? Authorizing Provider  aspirin EC 81 MG tablet Take 81 mg by mouth daily. Swallow whole.   Yes [provider]  omeprazole (PRILOSEC OTC) 20 MG tablet Take 2 tablets (40 mg total) by mouth daily. 02/06/22 02/06/23 Yes Shirlean Mylar, MD  cyclobenzaprine (FLEXERIL) 10 MG tablet Take 1 tablet (10 mg total) by mouth 2 (two) times daily as needed for muscle spasms. Patient not taking: Reported on 02/06/2022 04/07/18   Jacinto Halim, PA-C      Allergies    Patient has no known allergies.    Review of Systems   Review of Systems  Constitutional:  Negative for chills and fever.  Respiratory:  Negative for cough and shortness of breath.   Cardiovascular:  Negative for chest pain.  Gastrointestinal:  Positive for nausea and vomiting. Negative for abdominal pain, constipation and diarrhea.  All other systems reviewed and are negative.   Physical  Exam Updated Vital Signs BP 134/89 (BP Location: Right Arm)   Pulse 74   Temp 99.2 F (37.3 C) (Oral)   Resp 15   Ht 5\' 6"  (1.676 m)   Wt 69.9 kg   SpO2 100%   BMI 24.86 kg/m  Physical Exam Vitals and nursing note reviewed.  Constitutional:      General: He is not in acute distress.    Appearance: Normal appearance. He is normal weight. He is not ill-appearing, toxic-appearing or diaphoretic.  HENT:     Head: Normocephalic and atraumatic.     Nose: Nose normal.     Mouth/Throat:     Mouth: Mucous membranes are moist.     Pharynx: Oropharynx is clear. No oropharyngeal exudate or posterior oropharyngeal erythema.  Eyes:     Extraocular Movements: Extraocular movements intact.     Conjunctiva/sclera: Conjunctivae normal.     Pupils: Pupils are equal, round, and reactive to light.  Cardiovascular:     Rate and Rhythm: Normal rate and regular rhythm.     Pulses: Normal pulses.     Heart sounds: Normal heart sounds.  Pulmonary:     Effort: Pulmonary effort is normal.     Breath sounds: Normal breath sounds.  Abdominal:     General: Abdomen is flat. Bowel sounds are normal. There is no distension.     Palpations: Abdomen is soft. There is no mass.     Tenderness: There is  no abdominal tenderness. There is no guarding or rebound.     Hernia: No hernia is present.  Musculoskeletal:     Cervical back: Normal range of motion and neck supple.  Skin:    General: Skin is warm and dry.     Capillary Refill: Capillary refill takes less than 2 seconds.  Neurological:     General: No focal deficit present.     Mental Status: He is alert and oriented to person, place, and time.  Psychiatric:        Mood and Affect: Mood normal.        Behavior: Behavior normal.     ED Results / Procedures / Treatments   Labs (all labs ordered are listed, but only abnormal results are displayed) Labs Reviewed  COMPREHENSIVE METABOLIC PANEL  CBC WITH DIFFERENTIAL/PLATELET  LIPASE, BLOOD  TYPE  AND SCREEN  ABO/RH    EKG EKG Interpretation  Date/Time:  Monday February 06 2022 17:37:48 EDT Ventricular Rate:  75 PR Interval:  162 QRS Duration: 92 QT Interval:  348 QTC Calculation: 388 R Axis:   80 Text Interpretation: Normal sinus rhythm Normal ECG When compared with ECG of 25-May-2020 22:21, PREVIOUS ECG IS PRESENT Confirmed by Blane Ohara [id number]) on 02/06/2022 7:31:51 PM  Radiology No results found.  Procedures Procedures    Medications Ordered in ED Medications - No data to display  ED Course/ Medical Decision Making/ A&P                           Medical Decision Making 36 year old man with past medical history of left vertebral artery dissection and stroke presents today for follow-up of 1 episode of possible hematemesis.  Reviewed lab work which was completely normal: LFTs normal, lipase normal, hemoglobin stable at 15.  Very reassuring exam.  Considered upper GI bleed versus possible varices.  With history that is more consistent with coffee-ground emesis, less concerning for variceal bleed.  Recommend patient discontinue all alcohol consumption, suspect mild upper GI bleed versus PUD.  Will treat with PPI daily.  Recommend patient follow-up with GI outpatient, referral placed.  Recommend he also follow-up with his neurologist and discuss continued aspirin usage.  Risk OTC drugs.          Final Clinical Impression(s) / ED Diagnoses Final diagnoses:  Hematemesis with nausea    Rx / DC Orders ED Discharge Orders          Ordered    omeprazole (PRILOSEC OTC) 20 MG tablet  Daily        02/06/22 2010    Ambulatory referral to Gastroenterology       Comments: Patient on aspirin for h/o strokes with episode of bloody emesis   02/06/22 2010              Shirlean Mylar, MD 02/06/22 2106    Blane Ohara, MD 02/07/22 0009

## 2022-02-06 NOTE — ED Triage Notes (Signed)
Patient presents to Urgent Care with complaints of GERD/ acid reflex with new onset of vomiting blood last night Patient reports it is typical for him to have acid reflex and he takes Prilosec for symptoms last night he vomited after drinking some alcohol and noticed it was bright red blood. Pt st he is no longer nauseated but it feels like when he eats or dinks it doesn't go all the way down.

## 2022-02-06 NOTE — ED Notes (Signed)
 Patient is being discharged from the Urgent Care and sent to the Emergency Department via pov . Per mound, np, patient is in need of higher level of care due to vomiting blood. Patient is aware and verbalizes understanding of plan of care.

## 2022-04-03 ENCOUNTER — Encounter (HOSPITAL_COMMUNITY): Payer: Self-pay

## 2022-04-03 ENCOUNTER — Emergency Department (HOSPITAL_COMMUNITY)
Admission: EM | Admit: 2022-04-03 | Discharge: 2022-04-04 | Disposition: A | Discharge status: Home / Self Care | Payer: Self-pay | Attending: Emergency Medicine | Admitting: Emergency Medicine

## 2022-04-03 ENCOUNTER — Other Ambulatory Visit: Payer: Self-pay

## 2022-04-03 ENCOUNTER — Emergency Department (HOSPITAL_COMMUNITY): Payer: Self-pay

## 2022-04-03 DIAGNOSIS — Z7982 Long term (current) use of aspirin: Secondary | ICD-10-CM | POA: Diagnosis not present

## 2022-04-03 DIAGNOSIS — I10 Essential (primary) hypertension: Secondary | ICD-10-CM | POA: Insufficient documentation

## 2022-04-03 DIAGNOSIS — Y92410 Unspecified street and highway as the place of occurrence of the external cause: Secondary | ICD-10-CM | POA: Insufficient documentation

## 2022-04-03 DIAGNOSIS — S1190XA Unspecified open wound of unspecified part of neck, initial encounter: Secondary | ICD-10-CM | POA: Insufficient documentation

## 2022-04-03 DIAGNOSIS — Z23 Encounter for immunization: Secondary | ICD-10-CM | POA: Diagnosis not present

## 2022-04-03 DIAGNOSIS — S0990XA Unspecified injury of head, initial encounter: Secondary | ICD-10-CM | POA: Insufficient documentation

## 2022-04-03 DIAGNOSIS — S40812A Abrasion of left upper arm, initial encounter: Secondary | ICD-10-CM | POA: Diagnosis not present

## 2022-04-03 DIAGNOSIS — S169XXA Unspecified injury of muscle, fascia and tendon at neck level, initial encounter: Secondary | ICD-10-CM | POA: Diagnosis present

## 2022-04-03 MED ORDER — TETANUS-DIPHTH-ACELL PERTUSSIS 5-2.5-18.5 LF-MCG/0.5 IM SUSY
0.5000 mL | PREFILLED_SYRINGE | Freq: Once | INTRAMUSCULAR | Status: AC
  Administered 2022-04-04: 0.5 mL via INTRAMUSCULAR
  Filled 2022-04-03: qty 0.5
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
## 2022-04-04 ENCOUNTER — Encounter (HOSPITAL_COMMUNITY): Payer: Self-pay | Admitting: Student

## 2022-04-04 ENCOUNTER — Emergency Department (HOSPITAL_COMMUNITY): Payer: Self-pay

## 2022-04-04 LAB — I-STAT CHEM 8, ED
BUN: 21 mg/dL — ABNORMAL HIGH (ref 6–20)
Calcium, Ion: 1.23 mmol/L (ref 1.15–1.40)
Chloride: 106 mmol/L (ref 98–111)
Creatinine, Ser: 0.8 mg/dL (ref 0.61–1.24)
Glucose, Bld: 86 mg/dL (ref 70–99)
HCT: 44 % (ref 39.0–52.0)
Hemoglobin: 15 g/dL (ref 13.0–17.0)
Potassium: 3.7 mmol/L (ref 3.5–5.1)
Sodium: 140 mmol/L (ref 135–145)
TCO2: 26 mmol/L (ref 22–32)

## 2022-04-04 MED ORDER — IOHEXOL 350 MG/ML SOLN
65.0000 mL | Freq: Once | INTRAVENOUS | Status: AC | PRN
Start: 2022-04-04 — End: 2022-04-04
  Administered 2022-04-04: 65 mL via INTRAVENOUS

## 2022-04-04 MED ORDER — METHOCARBAMOL 500 MG PO TABS: 500.0000 mg | ORAL_TABLET | Freq: Three times a day (TID) | ORAL | 0 refills | Status: AC | PRN

## 2022-04-04 NOTE — ED Notes (Signed)
Patient transported to CT 
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
## 2022-04-04 NOTE — ED Notes (Signed)
Patient transported to X-ray 
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
## 2022-04-04 NOTE — ED Notes (Signed)
ED Provider at bedside. 

## 2022-04-04 NOTE — ED Notes (Signed)
Pt called 3x 

## 2023-11-02 ENCOUNTER — Other Ambulatory Visit: Payer: Self-pay

## 2023-11-02 ENCOUNTER — Emergency Department (HOSPITAL_COMMUNITY): Payer: Self-pay

## 2023-11-02 ENCOUNTER — Emergency Department (HOSPITAL_COMMUNITY)
Admission: EM | Admit: 2023-11-02 | Discharge: 2023-11-02 | Disposition: A | Discharge status: Home / Self Care | Payer: Self-pay | Attending: Emergency Medicine | Admitting: Emergency Medicine

## 2023-11-02 DIAGNOSIS — Z8673 Personal history of transient ischemic attack (TIA), and cerebral infarction without residual deficits: Secondary | ICD-10-CM | POA: Insufficient documentation

## 2023-11-02 DIAGNOSIS — I10 Essential (primary) hypertension: Secondary | ICD-10-CM | POA: Insufficient documentation

## 2023-11-02 DIAGNOSIS — E876 Hypokalemia: Secondary | ICD-10-CM | POA: Insufficient documentation

## 2023-11-02 DIAGNOSIS — Z7982 Long term (current) use of aspirin: Secondary | ICD-10-CM | POA: Insufficient documentation

## 2023-11-02 DIAGNOSIS — R079 Chest pain, unspecified: Secondary | ICD-10-CM | POA: Insufficient documentation

## 2023-11-02 LAB — TROPONIN I (HIGH SENSITIVITY)
Troponin I (High Sensitivity): 7 ng/L (ref <18)
Troponin I (High Sensitivity): 44 ng/L — ABNORMAL HIGH (ref <18)
Troponin I (High Sensitivity): 24 ng/L — ABNORMAL HIGH (ref <18)

## 2023-11-02 LAB — BASIC METABOLIC PANEL
Anion gap: 13 (ref 5–15)
BUN: 16 mg/dL (ref 6–20)
CO2: 21 mmol/L — ABNORMAL LOW (ref 22–32)
Calcium: 9.1 mg/dL (ref 8.9–10.3)
Chloride: 104 mmol/L (ref 98–111)
Creatinine, Ser: 0.91 mg/dL (ref 0.61–1.24)
GFR, Estimated: >60 mL/min (ref >60)
Glucose, Bld: 113 mg/dL — ABNORMAL HIGH (ref 70–99)
Potassium: 3.3 mmol/L — ABNORMAL LOW (ref 3.5–5.1)
Sodium: 138 mmol/L (ref 135–145)

## 2023-11-02 LAB — CBC
HCT: 46.1 % (ref 39.0–52.0)
Hemoglobin: 16.2 g/dL (ref 13.0–17.0)
MCH: 30.9 pg (ref 26.0–34.0)
MCHC: 35.1 g/dL (ref 30.0–36.0)
MCV: 87.8 fL (ref 80.0–100.0)
Platelets: 272 10*3/uL (ref 150–400)
RBC: 5.25 MIL/uL (ref 4.22–5.81)
RDW: 12.2 % (ref 11.5–15.5)
WBC: 9.4 10*3/uL (ref 4.0–10.5)
nRBC: 0 % (ref 0.0–0.2)

## 2023-11-02 LAB — D-DIMER, QUANTITATIVE: D-Dimer, Quant: <0.27 ug{FEU}/mL (ref 0.00–0.50)

## 2023-11-02 NOTE — ED Triage Notes (Signed)
 Patient reports left chest pain/palpitations this evening with mild SOB , no emesis or diaphoresis .

## 2023-11-02 NOTE — Discharge Instructions (Addendum)
 Evaluation today for your chest pain was overall reassuring.  Please follow-up with cardiology.  If your chest pain returns, you have shortness of breath or any other concerning symptom please return to the emergency department further evaluation.  Also recommend that you follow-up with your PCP.  Appointment details below: Dr. Odis Hollingshead 3/24 @2 :20 pm ([address])

## 2023-11-02 NOTE — ED Provider Notes (Addendum)
 Michael Brown AT [facility] Provider Note   CSN: [id number] Arrival date & time: 11/02/23  0128     History  Chief Complaint  Patient presents with   Chest Pain   HPI Michael Brown is a 38 y.o. male with stroke and HTN presenting for chest pain. Started around 12 am this morning. Left sided and feels like pressure. Non radiating. Also endorses some SHOB. Denies calf tenderness or recent long trips. Denies cough and fever. He is unsure if symptoms are worse with exertion.  Denies cocaine use but states he did drink 5 or 6 beers last night.  Denies abdominal pain.  States at this time his chest pain is resolved.   Chest Pain      Home Medications Prior to Admission medications   Medication Sig Start Date End Date Taking? Authorizing Provider  aspirin EC 81 MG tablet Take 81 mg by mouth daily. Swallow whole.    [provider]  methocarbamol (ROBAXIN) 500 MG tablet Take 1 tablet (500 mg total) by mouth every 8 (eight) hours as needed for muscle spasms. 04/04/22   Petrucelli, Lelon Mast R, PA-C  omeprazole (PRILOSEC OTC) 20 MG tablet Take 2 tablets (40 mg total) by mouth daily. 02/06/22 02/06/23  Shirlean Mylar, MD      Allergies    Patient has no known allergies.    Review of Systems   Review of Systems  Cardiovascular:  Positive for chest pain.    Physical Exam Updated Vital Signs BP 138/85 (BP Location: Right Arm)   Pulse 73   Temp 98.3 F (36.8 C) (Oral)   Resp 16   SpO2 99%  Physical Exam Vitals and nursing note reviewed.  HENT:     Head: Normocephalic and atraumatic.     Mouth/Throat:     Mouth: Mucous membranes are moist.  Eyes:     General:        Right eye: No discharge.        Left eye: No discharge.     Conjunctiva/sclera: Conjunctivae normal.  Cardiovascular:     Rate and Rhythm: Normal rate and regular rhythm.     Pulses: Normal pulses.     Heart sounds: Normal heart sounds.  Pulmonary:     Effort:  Pulmonary effort is normal.     Breath sounds: Normal breath sounds.  Abdominal:     General: Abdomen is flat.     Palpations: Abdomen is soft.     Tenderness: There is no abdominal tenderness.  Skin:    General: Skin is warm and dry.  Neurological:     General: No focal deficit present.  Psychiatric:        Mood and Affect: Mood normal.     ED Results / Procedures / Treatments   Labs (all labs ordered are listed, but only abnormal results are displayed) Labs Reviewed  BASIC METABOLIC PANEL - Abnormal; Notable for the following components:      Result Value   Potassium 3.3 (*)    CO2 21 (*)    Glucose, Bld 113 (*)    All other components within normal limits  TROPONIN I (HIGH SENSITIVITY) - Abnormal; Notable for the following components:   Troponin I (High Sensitivity) 44 (*)    All other components within normal limits  TROPONIN I (HIGH SENSITIVITY) - Abnormal; Notable for the following components:   Troponin I (High Sensitivity) 24 (*)    All other components within normal limits  CBC  D-DIMER, QUANTITATIVE  TROPONIN I (HIGH SENSITIVITY)    EKG EKG Interpretation Date/Time:  Friday November 02 2023 01:34:02 EDT Ventricular Rate:  108 PR Interval:  162 QRS Duration:  94 QT Interval:  340 QTC Calculation: 455 R Axis:   83  Text Interpretation: Sinus tachycardia Otherwise normal ECG When compared with ECG of 06-Feb-2022 17:37, PREVIOUS ECG IS PRESENT Confirmed by Estanislado Pandy [phone]) on 11/02/2023 7:46:20 AM  Radiology DG Chest 2 View Result Date: 11/02/2023 CLINICAL DATA:  Left-sided chest pain and palpitations. EXAM: CHEST - 2 VIEW COMPARISON:  None Available. FINDINGS: The heart size and mediastinal contours are within normal limits. Both lungs are clear. The visualized skeletal structures are unremarkable. IMPRESSION: No active cardiopulmonary disease. Electronically Signed   By: Aram Candela M.D.   On: 11/02/2023 02:06    Procedures Procedures     Medications Ordered in ED Medications - No data to display  ED Course/ Medical Decision Making/ A&P             HEART Score: 3                    Medical Decision Making Amount and/or Complexity of Data Reviewed Labs: ordered. Radiology: ordered.   Initial Impression and Ddx 38 year old well-appearing male presenting for chest pain.  Exam is unremarkable.  DDx includes ACS, PE, dissection, pneumonia, pneumothorax, other. Patient PMH that increases complexity of ED encounter:  stroke, HTN  Interpretation of Diagnostics - I independent reviewed and interpreted the labs as followed: Initial troponin was 44 and second was 24, mildly hypokalemic  - I independently visualized the following imaging with scope of interpretation limited to determining acute life threatening conditions related to emergency care: CXR, which revealed no acute derangement  -I reviewed and interpreted EKG which revealed sinus tachycardia  Patient Reassessment and Ultimate Disposition/Management On reassessment, patient remained asymptomatic.  Considered PE but unlikely given negative D-dimer and chest pain and heart rate normalized without intervention.  Heart score is 3.  Given that patient has remained chest pain-free throughout encounter, with a negative delta troponin and EKG reassuring, feel it is appropriate for discharge with close cardiology follow-up.  I was able to coordinate with cardiology and they are actively scheduling an appointment now.  Discussed return precautions.  Advised to follow-up with his PCP.  Discharged in good condition.  Patient management required discussion with the following services or consulting groups:  Cardiology  Complexity of Problems Addressed Acute complicated illness or Injury  Additional Data Reviewed and Analyzed Further history obtained from: Past medical history and medications listed in the EMR and Prior ED visit notes  Patient Encounter Risk  Assessment None         Final Clinical Impression(s) / ED Diagnoses Final diagnoses:  Chest pain, unspecified type    Rx / DC Orders ED Discharge Orders          Ordered    Ambulatory referral to Cardiology       Comments: If you have not heard from the Cardiology office within the next 72 hours please call [phone].   11/02/23 0917              Gareth Eagle, PA-C 11/02/23 0917    Gareth Eagle, PA-C 11/02/23 [id number]    Nira Conn, MD 11/09/23 [phone]

## 2023-11-05 ENCOUNTER — Ambulatory Visit: Payer: Self-pay | Attending: Cardiology | Admitting: Cardiology
